# Patient Record
Sex: Male | Born: 2000 | Race: Black or African American | Hispanic: No | Marital: Single | Smoking: Never smoker
Health system: Southern US, Community
[De-identification: ages and names within clinical notes are randomized; demographics above are authoritative.]

## PROBLEM LIST (undated history)

## (undated) DIAGNOSIS — R062 Wheezing: Secondary | ICD-10-CM

---
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
## 2020-11-17 ENCOUNTER — Ambulatory Visit: Payer: BC Managed Care – PPO | Attending: Family

## 2020-11-17 DIAGNOSIS — Z23 Encounter for immunization: Secondary | ICD-10-CM

## 2021-03-19 NOTE — Progress Notes (Signed)
   Covid-19 Vaccination Clinic  Name:  Jeremy Buchanan    MRN: [id number] DOB: 2001/10/18  03/19/2021  Mr. Jeremy Buchanan was observed post Covid-19 immunization for 15 minutes without incident. He was provided with Vaccine Information Sheet and instruction to access the V-Safe system.   Mr. Jeremy Buchanan was instructed to call [phone] with any severe reactions post vaccine: Marland Kitchen Difficulty breathing  . Swelling of face and throat  . A fast heartbeat  . A bad rash all over body  . Dizziness and weakness   Immunizations Administered    Name Date Dose VIS Date Route   Moderna COVID-19 Vaccine 11/17/2020  4:15 PM 0.5 mL 08/23/2020 Intramuscular   Manufacturer: Moderna   Lot: [id number]   NDC: 82505-397-67
